# Patient Record
Sex: Female | Born: 1950 | Race: White | Hispanic: No | Smoking: Never smoker
Health system: Southern US, Community
[De-identification: ages and names within clinical notes are randomized; demographics above are authoritative.]

---

## 2006-09-20 ENCOUNTER — Encounter: Admission: RE | Admit: 2006-09-20 | Discharge: 2006-09-20 | Payer: Self-pay | Admitting: Family Medicine

## 2016-03-04 ENCOUNTER — Other Ambulatory Visit: Payer: Self-pay | Admitting: Orthopaedic Surgery

## 2016-03-04 DIAGNOSIS — M47812 Spondylosis without myelopathy or radiculopathy, cervical region: Secondary | ICD-10-CM

## 2016-03-06 ENCOUNTER — Ambulatory Visit
Admission: RE | Admit: 2016-03-06 | Discharge: 2016-03-06 | Disposition: A | Discharge status: Home / Self Care | Payer: Medicare HMO | Source: Ambulatory Visit | Attending: Orthopaedic Surgery | Admitting: Orthopaedic Surgery

## 2016-03-06 DIAGNOSIS — M47812 Spondylosis without myelopathy or radiculopathy, cervical region: Secondary | ICD-10-CM

## 2016-10-21 ENCOUNTER — Ambulatory Visit (INDEPENDENT_AMBULATORY_CARE_PROVIDER_SITE_OTHER): Payer: Medicare HMO | Admitting: Orthopaedic Surgery

## 2016-10-21 ENCOUNTER — Encounter (INDEPENDENT_AMBULATORY_CARE_PROVIDER_SITE_OTHER): Payer: Self-pay | Admitting: Orthopaedic Surgery

## 2016-10-21 ENCOUNTER — Encounter (INDEPENDENT_AMBULATORY_CARE_PROVIDER_SITE_OTHER): Payer: Self-pay

## 2016-10-21 VITALS — BP 120/77 | HR 71 | Ht 60.0 in | Wt 108.0 lb

## 2016-10-21 DIAGNOSIS — M7702 Medial epicondylitis, left elbow: Secondary | ICD-10-CM | POA: Diagnosis not present

## 2016-10-21 DIAGNOSIS — M7701 Medial epicondylitis, right elbow: Secondary | ICD-10-CM

## 2016-10-21 NOTE — Progress Notes (Signed)
Office Visit Note   Patient: Stephanie Villegas           Date of Birth: 12-Dec-1950           MRN: [id number] Visit Date: 10/21/2016              Requested by: No referring provider defined for this encounter. PCP: [name], [name], MD   Assessment & Plan: Visit Diagnoses:  1. Medial epicondylitis of both elbows           Patient is much more symptomatic on the right elbow than left.  Plan: Injection performed right medial condyle with improvement in her symptoms. If she has persistent symptoms we may need to consider EMGs nerve conduction velocities.  Follow-Up Instructions: Return in about 3 weeks (around 11/11/2016), or if symptoms worsen or fail to improve.   Orders:  Orders Placed This Encounter  Procedures  . Medium Joint Injection/Arthrocentesis   No orders of the defined types were placed in this encounter.     Procedures: Medium Joint Inj Date/Time: 10/21/2016 11:29 AM Performed by: Eldred MangesYATES, Dariela Stoker C Authorized by: Eldred MangesYATES, Tasmin Exantus C   Site marked: the procedure site was marked   Location:  Elbow Site:  R elbow Ultrasound Guided: No   Fluoroscopic Guidance: No   Medications:  1 mL bupivacaine 0.5 %; 40 mg methylPREDNISolone acetate 40 MG/ML Aspiration Attempted: No    Right medial epicondyle injection. She had good relief postinjection.   Clinical Data: No additional findings.   Subjective: Chief Complaint  Patient presents with  . Right Elbow - Pain  . Left Elbow - Pain    Patient states bilateral elbow pain, with right elbow being the worse.  States left elbow is starting to bother her due to uses it more.  Has been going to physical therapy, but states it is not helping.  Currently not taking any pain medicine.  Negative for lower extremity weakness. Patient's been using a elbow splint for the medial epicondylitis but his had persistent symptoms.  Review of Systems  Constitutional: Negative for chills and diaphoresis.  HENT: Negative for ear discharge, ear  pain and nosebleeds.   Eyes: Negative for discharge and visual disturbance.  Respiratory: Negative for cough, choking and shortness of breath.   Cardiovascular: Negative for chest pain and palpitations.  Gastrointestinal: Negative for abdominal distention and abdominal pain.  Endocrine: Negative for cold intolerance and heat intolerance.  Genitourinary: Negative for flank pain and hematuria.  Musculoskeletal: Positive for neck pain.       Patient had some mild neck pain. She has had the pain with gripping and denies any activities where she had to do repetitive squeezing gripping. It is painful even with activities of daily living such as picking up a coffee cup or holding a pan.  Skin: Negative for rash and wound.  Neurological: Negative for seizures and speech difficulty.  Hematological: Negative for adenopathy. Does not bruise/bleed easily.  Psychiatric/Behavioral: Negative for agitation and suicidal ideas.     Objective: Vital Signs: BP 120/77   Pulse 71   Ht 5' (1.524 m)   Wt 108 lb (49 kg)   BMI 21.09 kg/m   Physical Exam  Constitutional: She is oriented to person, place, and time. She appears well-developed.  HENT:  Head: Normocephalic.  Right Ear: External ear normal.  Left Ear: External ear normal.  Eyes: Pupils are equal, round, and reactive to light.  Neck: No tracheal deviation present. No thyromegaly present.  Cardiovascular: Normal  rate.   Pulmonary/Chest: Effort normal.  Abdominal: Soft.  Musculoskeletal:  Patient has mild discomfort with percussion over the cubital tunnel right greater than left. No subluxation the ulnar nerve. Ulnar innervated muscles are normal. Mild brachial plexus tenderness on the right minimal left. Negative Spurling negative for bleed. Upper and lower extremity reflexes are 2+ and symmetrical. Chest pain point the medial epicondyle with resisted gripping on the right mild tenderness on the left medial upper condyle. Lateral condyle is normal  for elbow range of motion no impingement of the shoulder. Brachial plexus is only minimal tender on the right none on the left. No forearm flexor atrophy. Fundi supple my have good strength. No interossei weakness. Median nerve at the forearm and wrist is normal. Ulnar nerve at the hyperthenar region is normal.  Neurological: She is alert and oriented to person, place, and time.  Skin: Skin is warm and dry.  Psychiatric: She has a normal mood and affect. Her behavior is normal.    Ortho Exam  Specialty Comments:  No specialty comments available.  Imaging: No results found.   PMFS History: There are no active problems to display for this patient.  No past medical history on file.  No family history on file.  No past surgical history on file. Social History   Occupational History  . Not on file.   Social History Main Topics  . Smoking status: Never Smoker  . Smokeless tobacco: Never Used  . Alcohol use Not on file  . Drug use: Unknown  . Sexual activity: Not on file

## 2016-10-22 MED ORDER — BUPIVACAINE HCL 0.5 % IJ SOLN
1.0000 mL | INTRAMUSCULAR | Status: AC | PRN
  Administered 2016-10-21: 1 mL via INTRA_ARTICULAR

## 2016-10-22 MED ORDER — METHYLPREDNISOLONE ACETATE 40 MG/ML IJ SUSP
40.0000 mg | INTRAMUSCULAR | Status: AC | PRN
  Administered 2016-10-21: 40 mg via INTRA_ARTICULAR

## 2016-11-20 ENCOUNTER — Ambulatory Visit (INDEPENDENT_AMBULATORY_CARE_PROVIDER_SITE_OTHER): Payer: Medicare HMO | Admitting: Orthopaedic Surgery

## 2016-11-20 ENCOUNTER — Encounter (INDEPENDENT_AMBULATORY_CARE_PROVIDER_SITE_OTHER): Payer: Self-pay

## 2016-11-20 ENCOUNTER — Encounter (INDEPENDENT_AMBULATORY_CARE_PROVIDER_SITE_OTHER): Payer: Self-pay | Admitting: Orthopaedic Surgery

## 2016-11-20 VITALS — BP 132/85 | HR 72 | Ht 60.0 in | Wt 108.0 lb

## 2016-11-20 DIAGNOSIS — M47812 Spondylosis without myelopathy or radiculopathy, cervical region: Secondary | ICD-10-CM | POA: Diagnosis not present

## 2016-11-20 DIAGNOSIS — M7701 Medial epicondylitis, right elbow: Secondary | ICD-10-CM | POA: Diagnosis not present

## 2016-11-20 NOTE — Progress Notes (Signed)
Office Visit Note   Patient: Stephanie Villegas           Date of Birth: 11-09-1950           MRN: [id number] Visit Date: 11/20/2016              Requested by: Wilfred Curtis, MD [address] [place], [place] [address] PCP: Wilfred Curtis, MD   Assessment & Plan: Visit Diagnoses:  1. Medial epicondylitis of elbow, right   2. Spondylosis without myelopathy or radiculopathy, cervical region     Plan:Patient got good relief from the injection we had a long discussion about workout activity she should avoid where she has to do significant gripping to avoid problems with medial or lateral epicondylitis. Discussed cervical spondylosis avoiding military press activities. Cardio and core exercises were encouraged. She can return she has increase her symptoms either medial or lateral epicondylitis as well as cervical spondylosis.  Follow-Up Instructions: Return if symptoms worsen or fail to improve.   Orders:  No orders of the defined types were placed in this encounter.  No orders of the defined types were placed in this encounter.     Procedures: No procedures performed   Clinical Data: No additional findings.   Subjective: Chief Complaint  Patient presents with  . Left Elbow - Pain  . Right Elbow - Pain    Patient returns for follow up bilateral elbow pain. She is status post right elbow injection on 10/21/2016. She states that she is doing much better. She has gone to work out and noticed some numbness in the forearm so she would like to discuss what exercises she can do. She does states her left elbow is better as well.   States the medial epicondyle injection on the right to about 2-3 days to kick in and since then she's had absolutely no pain. She is working out with a Psychologist, educational and wanted to discuss activity she should do and not do as part of her workout to avoid reinjury. At times she sometimes has numbness more on the right forearm than the left. Some soreness  associated in the neck region when this occurs. She denies fever or chills.  Review of Systems  Constitutional: Negative for chills and diaphoresis.  HENT: Negative for ear discharge, ear pain and nosebleeds.   Eyes: Negative for discharge and visual disturbance.  Respiratory: Negative for cough, choking and shortness of breath.   Cardiovascular: Negative for chest pain and palpitations.  Gastrointestinal: Negative for abdominal distention and abdominal pain.  Endocrine: Negative for cold intolerance and heat intolerance.  Genitourinary: Negative for flank pain and hematuria.  Musculoskeletal: Positive for neck pain.       Patient relationship problems with balance is working with a Psychologist, educational for this. Previous right lateral epicondylar release. Cervical MRI documented cervical spondylosis primarily right sided C5-6 C6-7.  Skin: Negative for rash and wound.  Neurological: Negative for seizures and speech difficulty.  Hematological: Negative for adenopathy. Does not bruise/bleed easily.  Psychiatric/Behavioral: Negative for agitation and suicidal ideas.     Objective: Vital Signs: BP 132/85   Pulse 72   Ht 5' (1.524 m)   Wt 108 lb (49 kg)   BMI 21.09 kg/m   Physical Exam  Constitutional: She is oriented to person, place, and time. She appears well-developed.  HENT:  Head: Normocephalic.  Right Ear: External ear normal.  Left Ear: External ear normal.  Eyes: Pupils are equal, round, and reactive to light.  Neck: No tracheal deviation present. No thyromegaly present.  Cardiovascular: Normal rate.   Pulmonary/Chest: Effort normal.  Abdominal: Soft.  Musculoskeletal:  This is mildly complex tenderness in the right negative Spurling. Healed lateral epicondylar release incision on the left. No pain with resisted extension. Medial epicondyle is nontender L are reached full extension no impingement the shoulders the rash are exposed skin. Normal heel-to-toe gait. Sensory testing is  intact to the hands. No synovitis of the the fingers or wrist.  Neurological: She is alert and oriented to person, place, and time.  Skin: Skin is warm and dry.  Psychiatric: She has a normal mood and affect. Her behavior is normal.    Ortho Exam upper extremity reflexes are 2+ symmetrical biceps triceps brachioradialis  Specialty Comments:  No specialty comments available.  Imaging: No results found.   PMFS History: There are no active problems to display for this patient.  No past medical history on file.  No family history on file.  No past surgical history on file. Social History   Occupational History  . Not on file.   Social History Main Topics  . Smoking status: Never Smoker  . Smokeless tobacco: Never Used  . Alcohol use Not on file  . Drug use: Unknown  . Sexual activity: Not on file

## 2017-03-19 ENCOUNTER — Encounter (INDEPENDENT_AMBULATORY_CARE_PROVIDER_SITE_OTHER): Payer: Self-pay | Admitting: Orthopaedic Surgery

## 2017-03-19 ENCOUNTER — Ambulatory Visit (INDEPENDENT_AMBULATORY_CARE_PROVIDER_SITE_OTHER): Payer: Medicare HMO | Admitting: Orthopaedic Surgery

## 2017-03-19 VITALS — BP 108/78 | HR 70 | Ht 60.0 in | Wt 110.0 lb

## 2017-03-19 DIAGNOSIS — M25561 Pain in right knee: Secondary | ICD-10-CM

## 2017-03-19 MED ORDER — METHYLPREDNISOLONE ACETATE 40 MG/ML IJ SUSP
40.0000 mg | INTRAMUSCULAR | Status: AC | PRN
  Administered 2017-03-19: 40 mg via INTRA_ARTICULAR

## 2017-03-19 MED ORDER — LIDOCAINE HCL 1 % IJ SOLN
1.0000 mL | INTRAMUSCULAR | Status: AC | PRN
  Administered 2017-03-19: 1 mL

## 2017-03-19 MED ORDER — BUPIVACAINE HCL 0.25 % IJ SOLN
0.6600 mL | INTRAMUSCULAR | Status: AC | PRN
  Administered 2017-03-19: .66 mL via INTRA_ARTICULAR

## 2017-03-19 NOTE — Progress Notes (Signed)
Office Visit Note   Patient: Stephanie Villegas           Date of Birth: 25-Jul-1951           MRN: [id number] Visit Date: 03/19/2017              Requested by: [name], [name], MD [address] [place], [place] [address] PCP: [name], [name], MD   Assessment & Plan: Visit Diagnoses:  1. Right knee pain, unspecified chronicity     Plan: Discussed options requests we performed a right knee intra-articular cortisone injection. She can continue the knee sleeve intermittently occasional eyes. She has persistent symptoms or develops locking she will call we can proceed with MRI scan. Outside x-rays reviewed on disc which showed the no evidence of fracture. Minimal joint narrowing medially. No arterial calcification.  Follow-Up Instructions: No Follow-up on file.   Orders:  Orders Placed This Encounter  Procedures  . Large Joint Injection/Arthrocentesis   No orders of the defined types were placed in this encounter.     Procedures: Large Joint Inj Date/Time: 03/19/2017 8:57 AM Performed by: Eldred MangesYATES, Forrestine Lecrone C Authorized by: Eldred MangesYATES, Valorie Mcgrory C   Consent Given by:  Patient Indications:  Pain and joint swelling Location:  Knee Site:  R knee Needle Size:  22 G Needle Length:  1.5 inches Approach:  Anterolateral Ultrasound Guidance: No   Fluoroscopic Guidance: No   Arthrogram: No   Medications:  1 mL lidocaine 1 %; 40 mg methylPREDNISolone acetate 40 MG/ML; 0.66 mL bupivacaine 0.25 % Aspiration Attempted: No   Patient tolerance:  Patient tolerated the procedure well with no immediate complications     Clinical Data: No additional findings.   Subjective: Chief Complaint  Patient presents with  . Right Knee - Pain    HPI  66 year old female seen for new problem which is right knee pain. She thinks she may have injured in March she's been wearing a knee sleeve taken Advil. States the knee feels like it catches it hobbles her some. States her elbow are doing better but she  is at increased pain in her knee and is going on a cruise in 2 weeks and is concerned she'll have trouble walking. She denies fever chills no rheumatologic conditions. She denies hip pain. No numbness or tingling in her feet. No past history of knee injuries when she was younger. Previous treatment for elbow epicondylitis is doing well.  Review of Systems 14 for his systems updated from 10/21/2016 is unchanged of than improvement in her elbow symptoms and onset of new right knee pain. Has some occasional mild neck discomfort.   Objective: Vital Signs: BP 108/78   Pulse 70   Ht 5' (1.524 m)   Wt 110 lb (49.9 kg)   BMI 21.48 kg/m   Physical Exam  Constitutional: She is oriented to person, place, and time. She appears well-developed.  HENT:  Head: Normocephalic.  Right Ear: External ear normal.  Left Ear: External ear normal.  Eyes: Pupils are equal, round, and reactive to light.  Neck: No tracheal deviation present. No thyromegaly present.  Cardiovascular: Normal rate.   Pulmonary/Chest: Effort normal.  Abdominal: Soft.  Musculoskeletal:  Patient has no pain with hip range of motion straight leg raising 90. Negative Lockman. She is medial joint line tenderness. Some pain with patellofemoral loading. Collateral and cruciate ligament exam is normal. Distal pulses are intact no pitting edema. Sensation her foot is intact no sciatic notch tenderness pelvis is level  no scoliosis. No lymphadenopathy. No venous stasis changes. Increased discomfort with the loading the medial patellar facet. No Baker cyst palpable normal popliteal pulse.  Neurological: She is alert and oriented to person, place, and time.  Skin: Skin is warm and dry.  Psychiatric: She has a normal mood and affect. Her behavior is normal.    Ortho Exam  Specialty Comments:  No specialty comments available.  Imaging: No results found.   PMFS History: There are no active problems to display for this patient.  No past  medical history on file.  No family history on file.  No past surgical history on file. Social History   Occupational History  . Not on file.   Social History Main Topics  . Smoking status: Never Smoker  . Smokeless tobacco: Never Used  . Alcohol use Not on file  . Drug use: Unknown  . Sexual activity: Not on file

## 2017-05-28 ENCOUNTER — Telehealth (INDEPENDENT_AMBULATORY_CARE_PROVIDER_SITE_OTHER): Payer: Self-pay

## 2017-05-28 DIAGNOSIS — M25561 Pain in right knee: Secondary | ICD-10-CM

## 2017-05-28 NOTE — Telephone Encounter (Signed)
Order for MRI entered. Patient is advised.  Ms. Stephanie Villegas would also like to know what can be done about the pain in her elbow. She states that the injections help, but they only last a couple of months and then the pain is back. The pain is much worse right now. She knows that she has small tears that should heal, but she does not feel that they are getting better. Please advise.

## 2017-05-28 NOTE — Telephone Encounter (Signed)
Per last office note, if no relief from knee injection, proceed with MRI. Would you like for me to enter order?

## 2017-05-28 NOTE — Addendum Note (Signed)
Addended by: Rogers SeedsYEATTS, Dimitriy Carreras M on: 05/28/2017 02:15 PM   Modules accepted: Orders

## 2017-05-28 NOTE — Telephone Encounter (Signed)
Patient called stating that the cort. Shot she received in her right knee did not help at all and her right elbow is still bothering her.  Would like to know what the next step will be?  Cb# is [phone].  Please advise.  Thank You.

## 2017-05-28 NOTE — Telephone Encounter (Signed)
Ok for knee MRI thanks , rov after scan thanks

## 2017-05-29 NOTE — Telephone Encounter (Signed)
noted 

## 2017-05-29 NOTE — Telephone Encounter (Signed)
I called and discussed. She is waiting on approval for her knee MRI. Elbows bothering her worse had previous surgery by another surgeon on her elbow. This was back in 2007. 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 She'll call the office schedule appointment so we can check her neck and her elbow and try to help her with that pain. She will call on her own to schedule the appointment.FYI

## 2017-06-12 ENCOUNTER — Ambulatory Visit
Admission: RE | Admit: 2017-06-12 | Discharge: 2017-06-12 | Disposition: A | Discharge status: Home / Self Care | Payer: Medicare HMO | Source: Ambulatory Visit | Attending: Orthopaedic Surgery | Admitting: Orthopaedic Surgery

## 2017-06-12 DIAGNOSIS — M25561 Pain in right knee: Secondary | ICD-10-CM

## 2017-06-18 ENCOUNTER — Ambulatory Visit (INDEPENDENT_AMBULATORY_CARE_PROVIDER_SITE_OTHER): Payer: Medicare HMO | Admitting: Orthopaedic Surgery

## 2017-07-09 ENCOUNTER — Encounter (INDEPENDENT_AMBULATORY_CARE_PROVIDER_SITE_OTHER): Payer: Self-pay | Admitting: Orthopaedic Surgery

## 2017-07-09 ENCOUNTER — Ambulatory Visit (INDEPENDENT_AMBULATORY_CARE_PROVIDER_SITE_OTHER): Payer: Medicare HMO | Admitting: Orthopaedic Surgery

## 2017-07-09 VITALS — BP 123/82 | HR 71 | Ht 60.0 in | Wt 108.0 lb

## 2017-07-09 DIAGNOSIS — M7701 Medial epicondylitis, right elbow: Secondary | ICD-10-CM

## 2017-07-09 DIAGNOSIS — M93261 Osteochondritis dissecans, right knee: Secondary | ICD-10-CM

## 2017-07-09 NOTE — Progress Notes (Signed)
Office Visit Note   Patient: Stephanie Villegas           Date of Birth: [birth year]           MRN: [id number] Visit Date: 07/09/2017              Requested by: [name], [name], MD [address] [place], [place] [address] PCP: [name], [name], MD   Assessment & Plan: Visit Diagnoses:  1. Osteochondritis dissecans of right knee   2. Epicondylitis elbow, medial, right     Plan: She is anxious more than 50% better on Cymbalta. We discussed avoiding stairs, stairmaster. She can run exercise bike for cardiovascular fitness. We reviewed MRI images and report. New tennis elbow brace applied and will check her back again if she has increasing symptoms.  Follow-Up Instructions: No Follow-up on file.   Orders:  No orders of the defined types were placed in this encounter.  No orders of the defined types were placed in this encounter.     Procedures: No procedures performed   Clinical Data: No additional findings.   Subjective: Chief Complaint  Patient presents with  . Right Knee - Pain, Follow-up  . Right Elbow - Pain, Follow-up    HPI patient returns she continues to have problems with her right knee particularly with stairs has some swelling that occurs over the pes bursa and some pain along the medial joint line. MRI scan showed the osteochondral area on the patella with some adjacent changes to the lateral patellar facet suggesting friction syndrome. She's also had continued problems with right medial elbow pain or tenderness several braces worn out which she's used for many months. She recently started some Cymbalta and states that she thinks her pain is probably more than 50% relieved both knee and elbow with the Cymbalta. She's had previous MRI of her elbow which showed some tiny spurring several years ago. Previous injection medial condyle gave her some relief. Sometimes she has pain with outstretched reaching turning or twisting her arm suggesting intra-articular elbow  pathology versus medial epicondylitis. She doesn't really no pain when she does squeezing activities.  Review of Systems  Constitutional: Negative for chills and diaphoresis.  HENT: Negative for ear discharge, ear pain and nosebleeds.   Eyes: Negative for discharge and visual disturbance.  Respiratory: Negative for cough, choking and shortness of breath.   Cardiovascular: Negative for chest pain and palpitations.  Gastrointestinal: Negative for abdominal distention and abdominal pain.  Endocrine: Negative for cold intolerance and heat intolerance.  Genitourinary: Negative for flank pain and hematuria.  Skin: Negative for rash and wound.  Neurological: Negative for seizures and speech difficulty.  Hematological: Negative for adenopathy. Does not bruise/bleed easily.  Psychiatric/Behavioral: Negative for agitation and suicidal ideas.     Objective: Vital Signs: BP 123/82   Pulse 71   Ht 5' (1.524 m)   Wt 108 lb (49 kg)   BMI 21.09 kg/m   Physical Exam  Constitutional: She is oriented to person, place, and time. She appears well-developed.  HENT:  Head: Normocephalic.  Right Ear: External ear normal.  Left Ear: External ear normal.  Eyes: Pupils are equal, round, and reactive to light.  Neck: No tracheal deviation present. No thyromegaly present.  Cardiovascular: Normal rate.   Pulmonary/Chest: Effort normal.  Abdominal: Soft.  Neurological: She is alert and oriented to person, place, and time.  Skin: Skin is warm and dry.  Psychiatric: She has a normal mood and affect. Her  behavior is normal.    Ortho Exam slight tenderness right medial upper condyle. No subluxation of the ulnar nerve. With pronation supination outstretched reaching turning and twisting and also some shoulder range of motion she has pain around the elbow. Negative drop arm test negative impingement long of the biceps is tender minimally. No subluxation of the shoulder. Mild brachial plexus tenderness on the  right negative on the left. Flexion chin to chest normal extension. Reflexes are 2+ normal heel-to-toe gait. Mild medial joint line tenderness some crepitus with knee extension with patellofemoral loading on the right knee. Collateral cruciate ligament exam is normal.  Specialty Comments:  No specialty comments available.  Imaging: contrast  Study Result   CLINICAL DATA:  Medial right knee pain for 6 months.  EXAM: MRI OF THE RIGHT KNEE WITHOUT CONTRAST  TECHNIQUE: Multiplanar, multisequence MR imaging of the knee was performed. No intravenous contrast was administered.  COMPARISON:  None.  FINDINGS: MENISCI  Medial meniscus:  Unremarkable  Lateral meniscus:  Unremarkable  LIGAMENTS  Cruciates:  Unremarkable  Collaterals: Mildly thickened proximal MCL with low-grade surrounding edema suspicious for a small grade 2 sprain.  CARTILAGE  Patellofemoral: 7 mm non-fragmented osteochondral lesion of the medial patellar facet near the posterior patellar ridge, images 9-10 series 3, with faint heterogeneity of the overlying articular cartilage. Mild chondral thinning in the femoral trochlear groove.  Medial:  Mild degenerative chondral thinning.  Lateral:  Moderate degenerative chondral thinning.  Joint: Low-level edema in Hoffa' s fat pad just below the lateral patellar facet, image 1/6 and image 18/5.  Popliteal Fossa:  Unremarkable  Extensor Mechanism: Mildly poor definition of portions of the lateral patellar retinaculum. Otherwise unremarkable.  Bones: No significant extra-articular osseous abnormalities identified.  Other: No supplemental non-categorized findings.  IMPRESSION: 1. Subtle thickening and edema along the medial patellar retinaculum could reflect a mild grade 2 sprain. 2. Mild focal synovitis in Hoffa's fat pad just below the lateral patellar facet. 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 3. Non-fragmented osteochondral lesion of the medial patellar facet. Mild to moderate levels of degenerative chondral thinning in the compartments of the knee. 4. Poor definition of portions the lateral patellar retinaculum especially just below the level of the patella. Remote prior retinacular injury not excluded.   Electronically Signed   By: Gaylyn Rong M.D.   On: 06/13/2017 12:42       PMFS History: There are no active problems to display for this patient.  No past medical history on file.  No family history on file.  No past surgical history on file. Social History   Occupational History  . Not on file.   Social History Main Topics  . Smoking status: Never Smoker  . Smokeless tobacco: Never Used  . Alcohol use Not on file  . Drug use: Unknown  . Sexual activity: Not on file

## 2018-05-06 ENCOUNTER — Ambulatory Visit (INDEPENDENT_AMBULATORY_CARE_PROVIDER_SITE_OTHER): Payer: Medicare HMO

## 2018-05-06 ENCOUNTER — Encounter (INDEPENDENT_AMBULATORY_CARE_PROVIDER_SITE_OTHER): Payer: Self-pay | Admitting: Surgery

## 2018-05-06 ENCOUNTER — Ambulatory Visit (INDEPENDENT_AMBULATORY_CARE_PROVIDER_SITE_OTHER): Payer: Medicare HMO | Admitting: Surgery

## 2018-05-06 DIAGNOSIS — M25562 Pain in left knee: Secondary | ICD-10-CM

## 2018-05-06 DIAGNOSIS — M1712 Unilateral primary osteoarthritis, left knee: Secondary | ICD-10-CM

## 2018-05-06 DIAGNOSIS — M1711 Unilateral primary osteoarthritis, right knee: Secondary | ICD-10-CM | POA: Diagnosis not present

## 2018-05-06 DIAGNOSIS — M25561 Pain in right knee: Secondary | ICD-10-CM | POA: Diagnosis not present

## 2018-05-06 DIAGNOSIS — G8929 Other chronic pain: Secondary | ICD-10-CM

## 2018-05-06 NOTE — Progress Notes (Signed)
Office Visit Note   Patient: Stephanie Villegas           Date of Birth: 12/16/1950           MRN: [id number] Visit Date: 05/06/2018              Requested by: [name], [name], MD [address] [place], [place] [address] PCP: [name], [name], MD   Assessment & Plan: Visit Diagnoses:  1. Chronic pain of both knees   2. Unilateral primary osteoarthritis, left knee   3. Arthritis of right knee     Plan: Since patient states that her knee pain is actually doing better today she can take naproxen as needed.  If knee pain returns she will come back to see me for intra-articular Marcaine/Depo-Medrol injections.  All questions answered. Follow-Up Instructions: Return if symptoms worsen or fail to improve.   Orders:  Orders Placed This Encounter  Procedures  . XR KNEE 3 VIEW LEFT  . XR KNEE 3 VIEW RIGHT   No orders of the defined types were placed in this encounter.     Procedures: No procedures performed   Clinical Data: No additional findings.   Subjective: Chief Complaint  Patient presents with  . Left Knee - Pain    R<L  . Right Knee - Pain    HPI 67 year old white female comes in today with complaints of left greater than right knee pain.  She was last seen in the office by Dr. Kevan NyGates last year for right knee issues.  She had right knee MRI scan performed June 13, 2017 and report showed: EXAM: MRI OF THE RIGHT KNEE WITHOUT CONTRAST  TECHNIQUE: Multiplanar, multisequence MR imaging of the knee was performed. No intravenous contrast was administered.  COMPARISON:  None.  FINDINGS: MENISCI  Medial meniscus:  Unremarkable  Lateral meniscus:  Unremarkable  LIGAMENTS  Cruciates:  Unremarkable  Collaterals: Mildly thickened proximal MCL with low-grade surrounding edema suspicious for a small grade 2 sprain.  CARTILAGE  Patellofemoral: 7 mm non-fragmented osteochondral lesion of the medial patellar facet near the posterior patellar  ridge, images 9-10 series 3, with faint heterogeneity of the overlying articular cartilage. Mild chondral thinning in the femoral trochlear groove.  Medial:  Mild degenerative chondral thinning.  Lateral:  Moderate degenerative chondral thinning.  Joint: Low-level edema in Hoffa' s fat pad just below the lateral patellar facet, image 1/6 and image 18/5.  Popliteal Fossa:  Unremarkable  Extensor Mechanism: Mildly poor definition of portions of the lateral patellar retinaculum. Otherwise unremarkable.  Bones: No significant extra-articular osseous abnormalities identified.  Other: No supplemental non-categorized findings.  IMPRESSION: 1. Subtle thickening and edema along the medial patellar retinaculum could reflect a mild grade 2 sprain. 2. Mild focal synovitis in Hoffa's fat pad just below the lateral patellar facet. This can be seen in setting of patellar tendon -lateral femoral condyle friction syndrome. 3. Non-fragmented osteochondral lesion of the medial patellar facet. Mild to moderate levels of degenerative chondral thinning in the compartments of the knee. 4. Poor definition of portions the lateral patellar retinaculum especially just below the level of the patella. Remote prior retinacular injury not excluded.  She had fairly good response with Marcaine/Depo-Medrol injection.  2 months ago she fell coming down 4 steps landing directly onto both knees.  Since then she has had off-and-on left greater than right knee pain.  Did not have any problems with the left knee before injury.  She describes bilateral patellofemoral pain with  going up and down stairs.  Over the last couple days knee pain has improved.  Not describing any true mechanical symptoms or feeling of instability.  Left knee she also does have some pain over the medial joint line.  Review of Systems No current cardiac pulmonary GI GU issues  Objective: Vital Signs: There were no vitals taken for this  visit.  Physical Exam  Constitutional: She is oriented to person, place, and time. No distress.  HENT:  Head: Normocephalic and atraumatic.  Eyes: Pupils are equal, round, and reactive to light. EOM are normal.  Neck: Normal range of motion.  Pulmonary/Chest: No respiratory distress.  Musculoskeletal:  Gait is normal.  Negative logroll bilateral hips.  Negative straight leg raise.  Bile knees good range of motion.  She actually has left greater than right patellofemoral crepitus.  Right knee mildly tender medial plica.  Negative McMurray's test bilateral knees.  Cruciate collateral ligaments are stable bilaterally.  Left knee she is mildly tender over the medial joint line.  No palpable effusion.  Bilateral calves nontender.  Neurological: She is alert and oriented to person, place, and time.  Skin: Skin is warm and dry.  Psychiatric: She has a normal mood and affect.    Ortho Exam  Specialty Comments:  No specialty comments available.  Imaging: No results found.   PMFS History: There are no active problems to display for this patient.  History reviewed. No pertinent past medical history.  History reviewed. No pertinent family history.  History reviewed. No pertinent surgical history. Social History   Occupational History  . Not on file  Tobacco Use  . Smoking status: Never Smoker  . Smokeless tobacco: Never Used  Substance and Sexual Activity  . Alcohol use: Not on file  . Drug use: Not on file  . Sexual activity: Not on file

## 2018-05-20 ENCOUNTER — Ambulatory Visit (INDEPENDENT_AMBULATORY_CARE_PROVIDER_SITE_OTHER): Payer: Medicare HMO | Admitting: Surgery

## 2018-05-20 ENCOUNTER — Encounter (INDEPENDENT_AMBULATORY_CARE_PROVIDER_SITE_OTHER): Payer: Self-pay | Admitting: Surgery

## 2018-05-20 VITALS — Ht 60.0 in | Wt 108.0 lb

## 2018-05-20 DIAGNOSIS — M25561 Pain in right knee: Secondary | ICD-10-CM

## 2018-05-20 DIAGNOSIS — M25562 Pain in left knee: Secondary | ICD-10-CM | POA: Diagnosis not present

## 2018-05-20 DIAGNOSIS — G8929 Other chronic pain: Secondary | ICD-10-CM | POA: Diagnosis not present

## 2018-05-20 MED ORDER — METHYLPREDNISOLONE ACETATE 40 MG/ML IJ SUSP
40.0000 mg | INTRAMUSCULAR | Status: AC | PRN
  Administered 2018-05-20: 40 mg via INTRA_ARTICULAR

## 2018-05-20 MED ORDER — LIDOCAINE HCL 1 % IJ SOLN
3.0000 mL | INTRAMUSCULAR | Status: AC | PRN
  Administered 2018-05-20: 3 mL

## 2018-05-20 NOTE — Progress Notes (Signed)
   Office Visit Note   Patient: Stephanie Villegas           Date of Birth: 02/26/1951           MRN: [id number] Visit Date: 05/20/2018              Requested by: [name], [name], MD [address] [place], [place] [address] PCP: [name], [name], MD   Assessment & Plan: Visit Diagnoses:  1. Chronic pain of both knees     Plan: Patient's ongoing pain today I elected to do bilateral knee injections.  Patient sent bile knees were prepped with Betadine and intra-articular Marcaine/Depo-Medrol injection was done.  Follow-up in 4 weeks for recheck.  A week before patient scheduled appointment she will contact me and let me know how she is feeling.  If she continues to be symptomatic I may consider getting MRI scans before next appointment.  All questions answered.  Follow-Up Instructions: Return in about 4 weeks (around 06/17/2018) for with Fayrene FearingJames recheck knees.   Orders:  No orders of the defined types were placed in this encounter.  No orders of the defined types were placed in this encounter.     Procedures: Large Joint Inj: bilateral knee on 05/20/2018 9:23 AM Indications: pain Details: 25 G 1.5 in needle, anteromedial approach Medications (Right): 3 mL lidocaine 1 %; 40 mg methylPREDNISolone acetate 40 MG/ML Medications (Left): 3 mL lidocaine 1 %; 40 mg methylPREDNISolone acetate 40 MG/ML Outcome: tolerated well, no immediate complications Consent was given by the patient. Patient was prepped and draped in the usual sterile fashion.       Clinical Data: No additional findings.   Subjective: No chief complaint on file.   HPI 526 67-year-old white female returns with complaints of left greater than right knee pain.  States that she still continues to be symptomatic.  Describe mechanical symptoms on the left side more than right.  She would like to have both knees injected today as previously discussed last office visit.  Pain when she is ambulating, pivoting,  squatting. Review of Systems No current cardiac pulmonary GI GU issues  Objective: Vital Signs: There were no vitals taken for this visit.  Physical Exam Gait antalgic.  Extremely pleasant white female who looks much younger than stated age. Ortho Exam  Specialty Comments:  No specialty comments available.  Imaging: No results found.   PMFS History: There are no active problems to display for this patient.  History reviewed. No pertinent past medical history.  History reviewed. No pertinent family history.  History reviewed. No pertinent surgical history. Social History   Occupational History  . Not on file  Tobacco Use  . Smoking status: Never Smoker  . Smokeless tobacco: Never Used  Substance and Sexual Activity  . Alcohol use: Not on file  . Drug use: Not on file  . Sexual activity: Not on file

## 2018-06-10 ENCOUNTER — Encounter (INDEPENDENT_AMBULATORY_CARE_PROVIDER_SITE_OTHER): Payer: Self-pay | Admitting: Surgery

## 2018-06-10 ENCOUNTER — Ambulatory Visit (INDEPENDENT_AMBULATORY_CARE_PROVIDER_SITE_OTHER): Payer: Medicare HMO | Admitting: Surgery

## 2018-06-10 DIAGNOSIS — G8929 Other chronic pain: Secondary | ICD-10-CM

## 2018-06-10 DIAGNOSIS — M25562 Pain in left knee: Secondary | ICD-10-CM | POA: Diagnosis not present

## 2018-06-10 NOTE — Progress Notes (Signed)
   Office Visit Note   Patient: Stephanie Villegas           Date of Birth: 10-25-1950           MRN: [id number] Visit Date: 06/10/2018              Requested by: [name], [name], MD [address] [place], [place] [address] PCP: [name], [name], MD   Assessment & Plan: Visit Diagnoses:  1. Chronic pain of left knee     Plan: With patient's ongoing knee pain that has failed conservative treatment most recently with intra-articular Marcaine/Depo-Medrol injection I will schedule MRI to rule out meniscal tear/OCD.  Follow with Dr. Ophelia CharterYates in few weeks for recheck to discuss results and further treatment options.  States that she is going on vacation to [place] in about a week and so I did agree to repeat left knee injection today.  Patient understands that usually I do not repeat these injections so quickly but I am hoping to make her trip more enjoyable.  Follow-Up Instructions: Return in about 4 weeks (around 07/08/2018) for With Dr. Ophelia CharterYates to review left knee MRI.   Orders:  Orders Placed This Encounter  Procedures  . MR Knee Left w/o contrast   No orders of the defined types were placed in this encounter.     Procedures: No procedures performed   Clinical Data: No additional findings.   Subjective: Chief Complaint  Patient presents with  . Right Knee - Pain  . Left Knee - Pain    HPI 67 year old white female returns for recheck of both knees.  States that right knee is better after previous intra-articular Marcaine/Depo-Medrol injection.  Left knee did well for a couple weeks.  Continues to complain of pain mechanical symptoms mostly around the medial aspect.  Worse when she is squatting or pivoting.  She is going on a trip to [place] September 28 and is wanting to see if I can do something to help make her trip more enjoyable. Review of Systems No current cardiac pulmonary GI GU issues  Objective: Vital Signs: BP 109/77   Pulse 66   Ht 5' (1.524 m)   Wt 108 lb (49  kg)   BMI 21.09 kg/m   Physical Exam  Constitutional: She is oriented to person, place, and time. She appears well-developed. No distress.  HENT:  Head: Normocephalic and atraumatic.  Eyes: EOM are normal.  Pulmonary/Chest: No respiratory distress.  Musculoskeletal:  Left knee good range of motion.  Mild swelling.  Medial joint line tender.  Positive McMurray's test.  Ligaments are stable.  Calf nontender.  Neurological: She is alert and oriented to person, place, and time.    Ortho Exam  Specialty Comments:  No specialty comments available.  Imaging: No results found.   PMFS History: There are no active problems to display for this patient.  History reviewed. No pertinent past medical history.  History reviewed. No pertinent family history.  History reviewed. No pertinent surgical history. Social History   Occupational History  . Not on file  Tobacco Use  . Smoking status: Never Smoker  . Smokeless tobacco: Never Used  Substance and Sexual Activity  . Alcohol use: Not on file  . Drug use: Not on file  . Sexual activity: Not on file

## 2018-06-17 ENCOUNTER — Ambulatory Visit (INDEPENDENT_AMBULATORY_CARE_PROVIDER_SITE_OTHER): Payer: Medicare HMO | Admitting: Surgery

## 2018-07-06 ENCOUNTER — Ambulatory Visit
Admission: RE | Admit: 2018-07-06 | Discharge: 2018-07-06 | Disposition: A | Discharge status: Home / Self Care | Payer: Medicare HMO | Source: Ambulatory Visit | Attending: Surgery | Admitting: Surgery

## 2018-07-06 DIAGNOSIS — G8929 Other chronic pain: Secondary | ICD-10-CM

## 2018-07-06 DIAGNOSIS — M25562 Pain in left knee: Principal | ICD-10-CM

## 2018-07-08 ENCOUNTER — Telehealth (INDEPENDENT_AMBULATORY_CARE_PROVIDER_SITE_OTHER): Payer: Self-pay | Admitting: Radiology

## 2018-07-08 ENCOUNTER — Encounter (INDEPENDENT_AMBULATORY_CARE_PROVIDER_SITE_OTHER): Payer: Self-pay | Admitting: Orthopaedic Surgery

## 2018-07-08 ENCOUNTER — Ambulatory Visit (INDEPENDENT_AMBULATORY_CARE_PROVIDER_SITE_OTHER): Payer: Medicare HMO | Admitting: Orthopaedic Surgery

## 2018-07-08 VITALS — BP 128/93 | HR 88 | Ht 60.0 in | Wt 109.0 lb

## 2018-07-08 DIAGNOSIS — M1711 Unilateral primary osteoarthritis, right knee: Secondary | ICD-10-CM | POA: Diagnosis not present

## 2018-07-08 NOTE — Telephone Encounter (Signed)
Noted  

## 2018-07-08 NOTE — Progress Notes (Signed)
Office Visit Note   Patient: Stephanie Villegas           Date of Birth: 1951-01-01           MRN: [id number] Visit Date: 07/08/2018              Requested by: Wilfred Curtis, MD [address] [place], [place] [address] PCP: Wilfred Curtis, MD   Assessment & Plan: Visit Diagnoses:  1. Unilateral primary osteoarthritis, right knee          Severe and patellofemoral joint and moderately severe medial compartment.  We will seek approval from her insurance company for a single Monovisc injection to see if this gives her some relief.  We reviewed the MRI scan I gave her a copy of the report.  She has severe full-thickness patellofemoral degenerative changes but symptoms are present she does not think it is severe enough to consider total knee arthroplasty.  She like to proceed with the Visco injection at her request.  Plan: Follow-up visit for Monovisc and back injection once is approved for her right knee.  Follow-Up Instructions: No follow-ups on file.   Orders:  No orders of the defined types were placed in this encounter.  No orders of the defined types were placed in this encounter.     Procedures: No procedures performed   Clinical Data: No additional findings.   Subjective: Chief Complaint  Patient presents with  . Left Knee - Follow-up    HPI 67 year old female returns with ongoing problems with her left knee primarily going from sitting to standing as well as walking upstairs which she states is extremely painful.  She is used over the counter ibuprofen in the past knee injection 06/10/2018 with persistent symptoms.  MRI scan has been obtained and is available for review today.  She denies chills or fever no rheumatologic conditions.  She enjoys beach music and is not able to [place] dance due to her knee symptoms.  She is taken anti-inflammatories and had cortisone injection without sustained relief.  Review of Systems 14 point review of systems updated unchanged  from 07/09/2017 office note.   Objective: Vital Signs: BP (!) 128/93   Pulse 88   Ht 5' (1.524 m)   Wt 109 lb (49.4 kg)   BMI 21.29 kg/m   Physical Exam  Constitutional: She is oriented to person, place, and time. She appears well-developed.  HENT:  Head: Normocephalic.  Right Ear: External ear normal.  Left Ear: External ear normal.  Eyes: Pupils are equal, round, and reactive to light.  Neck: No tracheal deviation present. No thyromegaly present.  Cardiovascular: Normal rate.  Pulmonary/Chest: Effort normal.  Abdominal: Soft.  Neurological: She is alert and oriented to person, place, and time.  Skin: Skin is warm and dry.  Psychiatric: She has a normal mood and affect. Her behavior is normal.    Ortho Exam patient has some medial joint line tenderness pain with patellofemoral loading crepitus with knee extension.  Sharp pain when she gets up from sitting to standing and does not use her hands to help push herself up.  In standing position she is comfortable.  Pain with stair step.  Collateral crucial ligament exam is normal no pain with hip range of motion no sciatic notch tenderness.  Negative logroll to the hips distal pulses are intact no pitting edema no cellulitis. Specialty Comments:  No specialty comments available.  Imaging: CLINICAL DATA:  Locking catching. Snapping and crepitus in  the knee for greater than 6 weeks.  EXAM: MRI OF THE LEFT KNEE WITHOUT CONTRAST  TECHNIQUE: Multiplanar, multisequence MR imaging of the knee was performed. No intravenous contrast was administered.  COMPARISON:  None.  FINDINGS: MENISCI  Medial meniscus:  Intact.  Lateral meniscus:  Intact.  LIGAMENTS  Cruciates:  Intact ACL and PCL.  Collaterals:  Insert collateral  CARTILAGE  Patellofemoral: High-grade partial-thickness cartilage loss with full-thickness cartilage fissuring of the medial patellar facet with severe subchondral reactive marrow edema and  cystic changes.  Medial: Focal area of full-thickness cartilage loss of the medial femoral condyle the posterior weight-bearing surface measuring 8 x 12 mm.  Lateral:  No chondral defect.  Joint: No joint effusion. Mild edema in superolateral Hoffa's fat. No plical thickening.  Popliteal Fossa:  No Baker cyst. Intact popliteus tendon.  Extensor Mechanism: Intact quadriceps tendon. Intact patellar tendon. Intact medial patellar retinaculum. Intact lateral patellar retinaculum. Intact MPFL. TT-TG distance 11 mm.  Bones:  No acute osseous abnormality.  No aggressive osseous lesion.  Other: No fluid collection or hematoma.  Muscles are normal.  IMPRESSION: 1. No meniscal or ligamentous injury of the left knee. 2. High-grade partial-thickness cartilage loss with full-thickness cartilage fissuring of the medial patellar facet with severe subchondral reactive marrow edema and cystic changes. 3. Focal area of full-thickness cartilage loss of the medial femoral condyle the posterior weight-bearing surface measuring 8 x 12 mm. 4. Mild edema in superolateral Hoffa's fat as can be seen with patellar tendon-lateral femoral condyle friction syndrome.   Electronically Signed   By: Elige Ko   On: 07/06/2018 10:48   PMFS History: There are no active problems to display for this patient.  No past medical history on file.  No family history on file.  No past surgical history on file. Social History   Occupational History  . Not on file  Tobacco Use  . Smoking status: Never Smoker  . Smokeless tobacco: Never Used  Substance and Sexual Activity  . Alcohol use: Not on file  . Drug use: Not on file  . Sexual activity: Not on file

## 2018-07-08 NOTE — Telephone Encounter (Signed)
Please submit for approval for Monovisc injection.

## 2018-07-10 ENCOUNTER — Telehealth (INDEPENDENT_AMBULATORY_CARE_PROVIDER_SITE_OTHER): Payer: Self-pay

## 2018-07-10 NOTE — Telephone Encounter (Signed)
Submitted VOB for Monovisc, left knee. 

## 2018-07-17 ENCOUNTER — Telehealth (INDEPENDENT_AMBULATORY_CARE_PROVIDER_SITE_OTHER): Payer: Self-pay

## 2018-07-17 NOTE — Telephone Encounter (Signed)
Talked with patient and advised her that she is approved for Monovisc, left knee. Buy & Bill Patient will be responsible for 20% OOP. No PA required for Monvisc per [facility] Reference# [id number]  Appt.scheduled 07/20/2018

## 2018-07-20 ENCOUNTER — Encounter (INDEPENDENT_AMBULATORY_CARE_PROVIDER_SITE_OTHER): Payer: Self-pay | Admitting: Orthopaedic Surgery

## 2018-07-20 ENCOUNTER — Ambulatory Visit (INDEPENDENT_AMBULATORY_CARE_PROVIDER_SITE_OTHER): Payer: Medicare HMO | Admitting: Orthopaedic Surgery

## 2018-07-20 VITALS — BP 133/90 | HR 72 | Ht 60.0 in | Wt 109.0 lb

## 2018-07-20 DIAGNOSIS — M1712 Unilateral primary osteoarthritis, left knee: Secondary | ICD-10-CM | POA: Diagnosis not present

## 2018-07-20 MED ORDER — HYALURONAN 88 MG/4ML IX SOSY
88.0000 mg | PREFILLED_SYRINGE | INTRA_ARTICULAR | Status: AC | PRN
  Administered 2018-07-20: 88 mg via INTRA_ARTICULAR

## 2018-07-20 MED ORDER — LIDOCAINE HCL 1 % IJ SOLN
0.5000 mL | INTRAMUSCULAR | Status: AC | PRN
  Administered 2018-07-20: .5 mL

## 2018-07-20 NOTE — Progress Notes (Signed)
   Office Visit Note   Patient: Stephanie Villegas           Date of Birth: 06-Apr-1951           MRN: [id number] Visit Date: 07/20/2018              Requested by: Wilfred Curtis, MD [address] [place], [place] [address] PCP: Wilfred Curtis, MD   Assessment & Plan: Visit Diagnoses:  1. Arthritis of left knee     Plan: left knee Monovisc injection done. She toterated it well. She will call in a few weeks and let us know how she is doing.   Follow-Up Instructions: No follow-ups on file.   Orders:  Orders Placed This Encounter  Procedures  . Large Joint Inj   No orders of the defined types were placed in this encounter.     Procedures: Large Joint Inj: L knee on 07/20/2018 3:32 PM Indications: joint swelling and pain Details: 22 G 1.5 in needle, anterolateral approach  Arthrogram: No  Medications: 0.5 mL lidocaine 1 %; 88 mg Hyaluronan 88 MG/4ML Outcome: tolerated well, no immediate complications Procedure, treatment alternatives, risks and benefits explained, specific risks discussed. Consent was given by the patient. Immediately prior to procedure a time out was called to verify the correct patient, procedure, equipment, support staff and site/side marked as required. Patient was prepped and draped in the usual sterile fashion.       Clinical Data: No additional findings.   Subjective: Chief Complaint  Patient presents with  . Left Knee - Pain    Monovisc Injection Left Knee    HPIpt returns for left knee monovisc injection for OA. See previous note.   Review of Systemsunchanged.    Objective: Vital Signs: BP 133/90   Pulse 72   Ht 5' (1.524 m)   Wt 109 lb (49.4 kg)   BMI 21.29 kg/m   Physical Examno change from last visit.   Ortho Examknee tenderness medial and PF joint.   Specialty Comments:  No specialty comments available.  Imaging: No results found.   PMFS History: Patient Active Problem List   Diagnosis Date Noted  .  Unilateral primary osteoarthritis, right knee 07/08/2018   No past medical history on file.  No family history on file.  No past surgical history on file. Social History   Occupational History  . Not on file  Tobacco Use  . Smoking status: Never Smoker  . Smokeless tobacco: Never Used  Substance and Sexual Activity  . Alcohol use: Not on file  . Drug use: Not on file  . Sexual activity: Not on file

## 2018-07-30 ENCOUNTER — Telehealth (INDEPENDENT_AMBULATORY_CARE_PROVIDER_SITE_OTHER): Payer: Self-pay | Admitting: Orthopaedic Surgery

## 2018-07-30 NOTE — Telephone Encounter (Signed)
Recent Monovisc injection in left knee. OK to submit for authorization for right knee?

## 2018-07-30 NOTE — Telephone Encounter (Signed)
Patient called to let Dr. Ophelia Charter know that she is wanting a gel injection in her right knee.  CB#[phone].  Thank you.

## 2018-07-30 NOTE — Telephone Encounter (Signed)
OK 

## 2018-07-30 NOTE — Telephone Encounter (Signed)
Please see below. Patient would like to have Monovisc injection in right knee.  Thanks.

## 2018-07-31 NOTE — Telephone Encounter (Signed)
Noted  

## 2018-08-12 ENCOUNTER — Telehealth (INDEPENDENT_AMBULATORY_CARE_PROVIDER_SITE_OTHER): Payer: Self-pay

## 2018-08-12 NOTE — Telephone Encounter (Signed)
Error

## 2018-08-12 NOTE — Telephone Encounter (Signed)
Submitted VOB for Monovisc, right knee. 

## 2018-09-08 ENCOUNTER — Telehealth (INDEPENDENT_AMBULATORY_CARE_PROVIDER_SITE_OTHER): Payer: Self-pay

## 2018-09-08 NOTE — Telephone Encounter (Signed)
Talked with patient and advised her that she is approved for gel injection.  Approved for Monovisc, right knee. Buy & Bill Patient will be responsible for 20% OOP. No Co-pay No PA required, per Tiffany F.with [employer]12/17/2019.  Appt.09/22/2018 with Dr. Ophelia CharterYates

## 2018-09-22 ENCOUNTER — Ambulatory Visit (INDEPENDENT_AMBULATORY_CARE_PROVIDER_SITE_OTHER): Payer: Medicare HMO | Admitting: Orthopaedic Surgery

## 2018-09-22 ENCOUNTER — Encounter (INDEPENDENT_AMBULATORY_CARE_PROVIDER_SITE_OTHER): Payer: Self-pay | Admitting: Orthopaedic Surgery

## 2018-09-22 VITALS — BP 143/89 | HR 80 | Ht 60.0 in | Wt 108.0 lb

## 2018-09-22 DIAGNOSIS — M1711 Unilateral primary osteoarthritis, right knee: Secondary | ICD-10-CM | POA: Diagnosis not present

## 2018-09-22 MED ORDER — LIDOCAINE HCL 1 % IJ SOLN
0.5000 mL | INTRAMUSCULAR | Status: AC | PRN
  Administered 2018-09-22: .5 mL

## 2018-09-22 MED ORDER — HYALURONAN 88 MG/4ML IX SOSY
88.0000 mg | PREFILLED_SYRINGE | INTRA_ARTICULAR | Status: AC | PRN
  Administered 2018-09-22: 88 mg via INTRA_ARTICULAR

## 2018-09-22 NOTE — Progress Notes (Signed)
   Office Visit Note   Patient: Stephanie Villegas           Date of Birth: 02-Jun-1951           MRN: [id number] Visit Date: 09/22/2018              Requested by: [name], [name], MD [address] [place], [place] [address] PCP: [name], [name], MD   Assessment & Plan: Visit Diagnoses:  1. Unilateral primary osteoarthritis, right knee     Plan: Previous MRI showed partial-thickness loss.  She is got excellent result with Monovisc injection in her left knee several months ago.  She is here today for injection in her right knee which was performed.  She can get it repeated in 6 months if she gets excellent relief once again.  Follow-Up Instructions: No follow-ups on file.   Orders:  Orders Placed This Encounter  Procedures  . Large Joint Inj   No orders of the defined types were placed in this encounter.     Procedures: Large Joint Inj: R knee on 09/22/2018 4:12 PM Indications: pain and joint swelling Details: 22 G 1.5 in needle, anterolateral approach  Arthrogram: No  Medications: 0.5 mL lidocaine 1 %; 88 mg Hyaluronan 88 MG/4ML Outcome: tolerated well, no immediate complications Procedure, treatment alternatives, risks and benefits explained, specific risks discussed. Consent was given by the patient. Immediately prior to procedure a time out was called to verify the correct patient, procedure, equipment, support staff and site/side marked as required. Patient was prepped and draped in the usual sterile fashion.       Clinical Data: No additional findings.   Subjective: Chief Complaint  Patient presents with  . Right Knee - Follow-up    HPI patient returns for  Monovisc injection into her right knee.  Left knee previous injection 07/20/2018 after 3 weeks gave her great relief and she returns for her injection now in her right knee.  Review of Systems updated and unchanged.   Objective: Vital Signs: BP (!) 143/89   Pulse 80   Ht 5' (1.524 m)   Wt 108  lb (49 kg)   BMI 21.09 kg/m   Physical Exam alert oriented no shortness of breath vital signs regular.  Ortho Exam mild crepitus with knee range of motion.  Specialty Comments:  No specialty comments available.  Imaging: No results found.   PMFS History: Patient Active Problem List   Diagnosis Date Noted  . Unilateral primary osteoarthritis, right knee 07/08/2018   No past medical history on file.  No family history on file.  No past surgical history on file. Social History   Occupational History  . Not on file  Tobacco Use  . Smoking status: Never Smoker  . Smokeless tobacco: Never Used  Substance and Sexual Activity  . Alcohol use: Not on file  . Drug use: Not on file  . Sexual activity: Not on file

## 2019-03-16 ENCOUNTER — Ambulatory Visit: Payer: Self-pay

## 2019-03-16 ENCOUNTER — Other Ambulatory Visit: Payer: Self-pay

## 2019-03-16 ENCOUNTER — Telehealth: Payer: Self-pay | Admitting: Radiology

## 2019-03-16 ENCOUNTER — Ambulatory Visit (INDEPENDENT_AMBULATORY_CARE_PROVIDER_SITE_OTHER): Payer: Medicare HMO | Admitting: Orthopaedic Surgery

## 2019-03-16 ENCOUNTER — Encounter: Payer: Self-pay | Admitting: Orthopaedic Surgery

## 2019-03-16 VITALS — Ht 60.0 in | Wt 111.0 lb

## 2019-03-16 DIAGNOSIS — M25512 Pain in left shoulder: Secondary | ICD-10-CM

## 2019-03-16 DIAGNOSIS — M7542 Impingement syndrome of left shoulder: Secondary | ICD-10-CM

## 2019-03-16 NOTE — Telephone Encounter (Signed)
Please seek approval for gel injections bilateral knees. Patient had Left knee Monovisc Injection 07/20/18 and Right Knee Monovisc Injection 09/22/18. Thanks.

## 2019-03-16 NOTE — Progress Notes (Signed)
Office Visit Note   Patient: Stephanie Villegas           Date of Birth: Jan 18, 1951           MRN: [id number] Visit Date: 03/16/2019              Requested by: [name], [name], MD [address] [place] [id number] PCP: [name], [name], MD   Assessment & Plan: Visit Diagnoses:  1. Acute pain of left shoulder     Plan: Subacromial injection performed which she tolerated well.  Patient got good relief last year with the Visco injection in her knees and is requesting repeat authorization to her insurance company.  If she has persistent problems with his shoulder we can consider diagnostic imaging.  Postinjection she had improved range of motion and decreased pain.  Follow-Up Instructions: No follow-ups on file.   Orders:  Orders Placed This Encounter  Procedures  . XR Shoulder Left   No orders of the defined types were placed in this encounter.     Procedures: Large Joint Inj: L subacromial bursa on 03/16/2019 9:43 AM Indications: pain Details: 22 G 1.5 in needle  Arthrogram: No  Medications: 4 mL bupivacaine 0.25 %; 40 mg methylPREDNISolone acetate 40 MG/ML; 0.5 mL lidocaine 1 % Outcome: tolerated well, no immediate complications Procedure, treatment alternatives, risks and benefits explained, specific risks discussed. Consent was given by the patient. Immediately prior to procedure a time out was called to verify the correct patient, procedure, equipment, support staff and site/side marked as required. Patient was prepped and draped in the usual sterile fashion.       Clinical Data: No additional findings.   Subjective: Chief Complaint  Patient presents with  . Left Shoulder - Pain  . Left Knee - Pain  . Right Knee - Pain    HPI 68 year old female seen with onset of left shoulder pain x2 months.  Patient been through physical therapy and states she can barely reach her arm up overhead.  She has increased pain with abduction and with flexion above  the shoulder level.  No numbness or tingling no associated neck pain.  She is also had some problems with bilateral knee pain and is requesting scheduling repeat Visco injections.  She is tried cortisone injection without relief but did get real relief with the Visco injections.  She has been taking meloxicam regularly.  Review of Systems updated unchanged from 07/09/2017 other than as mentioned in HPI.  Problems with bilateral knee osteoarthritis.   Objective: Vital Signs: Ht 5' (1.524 m)   Wt 111 lb (50.3 kg)   BMI 21.68 kg/m   Physical Exam Constitutional:      Appearance: She is well-developed.  HENT:     Head: Normocephalic.     Right Ear: External ear normal.     Left Ear: External ear normal.  Eyes:     Pupils: Pupils are equal, round, and reactive to light.  Neck:     Thyroid: No thyromegaly.     Trachea: No tracheal deviation.  Cardiovascular:     Rate and Rhythm: Normal rate.  Pulmonary:     Effort: Pulmonary effort is normal.  Abdominal:     Palpations: Abdomen is soft.  Skin:    General: Skin is warm and dry.  Neurological:     Mental Status: She is alert and oriented to person, place, and time.  Psychiatric:        Behavior: Behavior normal.  Ortho Exam patient has positive left shoulder impingement.  Acromioclavicular joints normal no brachial plexus tenderness.  Negative Spurling.  Biceps triceps brachial radialis are 2+ and symmetrical.  Positive Neer.  Negative Hawkins.  Long of the biceps is normal.  Patient lacks 15-20 degrees full abduction.  Passively I can get her arm up overhead without significant adhesions or resistance.  Triceps biceps is normal.  Station the hand is intact.  Specialty Comments:  No specialty comments available.  Imaging: No results found.   PMFS History: Patient Active Problem List   Diagnosis Date Noted  . Unilateral primary osteoarthritis, right knee 07/08/2018   No past medical history on file.  No family history  on file.  No past surgical history on file. Social History   Occupational History  . Not on file  Tobacco Use  . Smoking status: Never Smoker  . Smokeless tobacco: Never Used  Substance and Sexual Activity  . Alcohol use: Not on file  . Drug use: Not on file  . Sexual activity: Not on file

## 2019-03-17 MED ORDER — LIDOCAINE HCL 1 % IJ SOLN
0.5000 mL | INTRAMUSCULAR | Status: AC | PRN
  Administered 2019-03-16: .5 mL

## 2019-03-17 MED ORDER — METHYLPREDNISOLONE ACETATE 40 MG/ML IJ SUSP
40.0000 mg | INTRAMUSCULAR | Status: AC | PRN
  Administered 2019-03-16: 10:00:00 40 mg via INTRA_ARTICULAR

## 2019-03-17 MED ORDER — BUPIVACAINE HCL 0.25 % IJ SOLN
4.0000 mL | INTRAMUSCULAR | Status: AC | PRN
  Administered 2019-03-16: 10:00:00 4 mL via INTRA_ARTICULAR

## 2019-03-19 NOTE — Telephone Encounter (Signed)
Printed demographics for submission.  

## 2019-03-24 ENCOUNTER — Telehealth: Payer: Self-pay

## 2019-03-24 NOTE — Telephone Encounter (Signed)
Patient is approved for Monovisc, bilateral knee per Wendy May. Buy & Bill No Co-pay No PA required  Appt. 03/31/2019 with Dr. Lorin Mercy

## 2019-03-31 ENCOUNTER — Ambulatory Visit (INDEPENDENT_AMBULATORY_CARE_PROVIDER_SITE_OTHER): Payer: Medicare HMO | Admitting: Orthopaedic Surgery

## 2019-03-31 ENCOUNTER — Other Ambulatory Visit: Payer: Self-pay

## 2019-03-31 ENCOUNTER — Encounter: Payer: Self-pay | Admitting: Orthopaedic Surgery

## 2019-03-31 DIAGNOSIS — M170 Bilateral primary osteoarthritis of knee: Secondary | ICD-10-CM | POA: Diagnosis not present

## 2019-03-31 MED ORDER — HYALURONAN 88 MG/4ML IX SOSY
88.0000 mg | PREFILLED_SYRINGE | INTRA_ARTICULAR | Status: AC | PRN
  Administered 2019-03-31: 88 mg via INTRA_ARTICULAR

## 2019-03-31 MED ORDER — LIDOCAINE HCL 1 % IJ SOLN
0.5000 mL | INTRAMUSCULAR | Status: AC | PRN
  Administered 2019-03-31: .5 mL

## 2019-03-31 NOTE — Progress Notes (Signed)
Office Visit Note   Patient: Stephanie Villegas           Date of Birth: 15-Apr-1951           MRN: [id number] Visit Date: 03/31/2019              Requested by: Leotis Pain, [place],  [place] [address] PCP: Leotis Pain, MD   Assessment & Plan: Visit Diagnoses:  1. Bilateral primary osteoarthritis of knee     Plan: Right and left knee Monovisc injections performed she tolerated the injection well.  Follow-Up Instructions: Return if symptoms worsen or fail to improve.   Orders:  Orders Placed This Encounter  Procedures  . Large Joint Inj  . Large Joint Inj   No orders of the defined types were placed in this encounter.     Procedures: Large Joint Inj: R knee on 03/31/2019 10:51 AM Indications: pain and joint swelling Details: 22 G 1.5 in needle, anterolateral approach  Arthrogram: No  Medications: 0.5 mL lidocaine 1 %; 88 mg Hyaluronan 88 MG/4ML Outcome: tolerated well, no immediate complications Procedure, treatment alternatives, risks and benefits explained, specific risks discussed. Consent was given by the patient. Immediately prior to procedure a time out was called to verify the correct patient, procedure, equipment, support staff and site/side marked as required. Patient was prepped and draped in the usual sterile fashion.   Large Joint Inj: L knee on 03/31/2019 10:51 AM Indications: joint swelling and pain Details: 22 G 1.5 in needle, anterolateral approach  Arthrogram: No  Medications: 0.5 mL lidocaine 1 %; 88 mg Hyaluronan 88 MG/4ML Outcome: tolerated well, no immediate complications Procedure, treatment alternatives, risks and benefits explained, specific risks discussed. Consent was given by the patient. Immediately prior to procedure a time out was called to verify the correct patient, procedure, equipment, support staff and site/side marked as required. Patient was prepped and draped in the usual sterile fashion.        Clinical Data: No additional findings.   Subjective: Chief Complaint  Patient presents with  . Left Knee - Pain    Monovisc Injection   . Right Knee - Pain    Monovisc Injection    HPI 68 year old female returns for injection of Monovisc which is been approved for both right and left knee.  Review of Systems updated unchanged   Objective: Vital Signs: Ht 5' (1.524 m)   Wt 111 lb (50.3 kg)   BMI 21.68 kg/m   Physical Exam no change.  Ortho Exam patient has abrasion over the lateral joint line right knee from a fall last weekend.  Opposite left knee shows trace abrasion over the tibial tubercle.  Distal pulses are intact no cellulitis.  Trace right and left knee effusion.  Ligamentous exam is normal.  Specialty Comments:  No specialty comments available.  Imaging: No results found.   PMFS History: Patient Active Problem List   Diagnosis Date Noted  . Bilateral primary osteoarthritis of knee 03/31/2019  . Unilateral primary osteoarthritis, right knee 07/08/2018   No past medical history on file.  No family history on file.  No past surgical history on file. Social History   Occupational History  . Not on file  Tobacco Use  . Smoking status: Never Smoker  . Smokeless tobacco: Never Used  Substance and Sexual Activity  . Alcohol use: Not on file  . Drug use: Not on file  . Sexual activity: Not on file

## 2020-04-28 IMAGING — MR MR KNEE*L* W/O CM
4 of 7 series · 23 of 40 positions shown · non-contrast
Comparison: None.

CLINICAL DATA: Locking catching. Snapping and crepitus in the knee
for greater than 6 weeks.

EXAM:
MRI OF THE LEFT KNEE WITHOUT CONTRAST
TECHNIQUE: Multiplanar, multisequence MR imaging of the knee was performed. No
intravenous contrast was administered.

[Series 4: T2 fat-sat · coronal · 4.0mm · 0.59mm/px · 6 of 23 slices shown (1 of 2)]
[im 1/23]
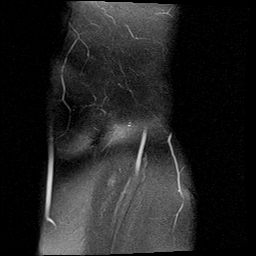
[im 5/23]
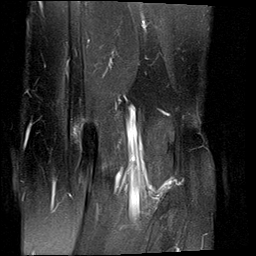
[im 9/23]
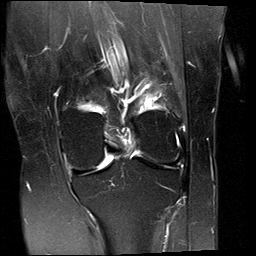
[im 14/23]
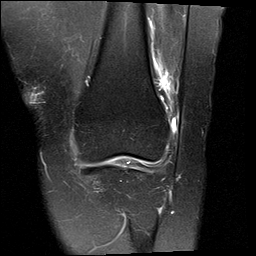
[im 18/23]
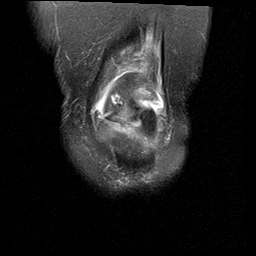
[im 23/23]
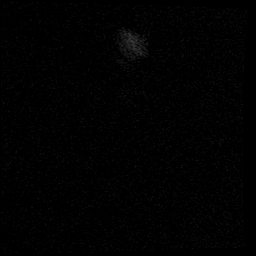

[Series 5: T1 · coronal · 4.0mm · 0.29mm/px · 5 of 23 slices shown]
[im 1/23]
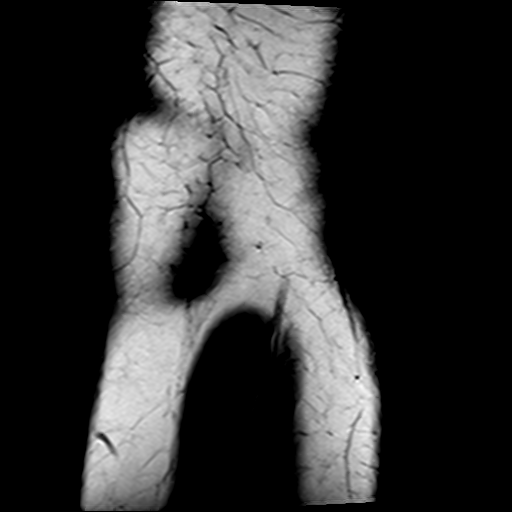
[im 5/23]
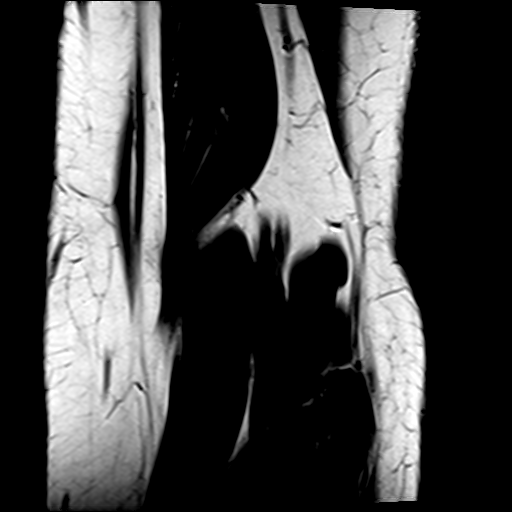
[im 9/23]
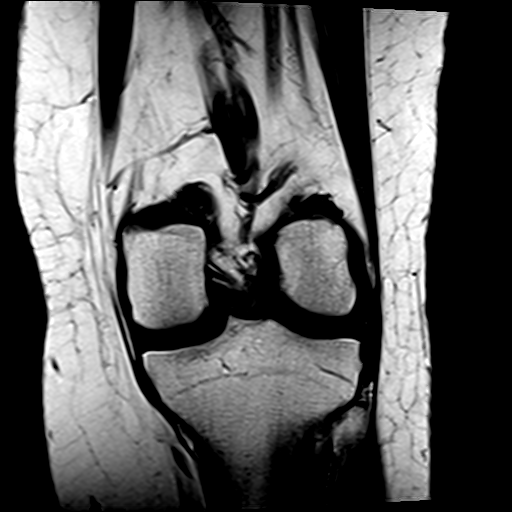
[im 14/23]
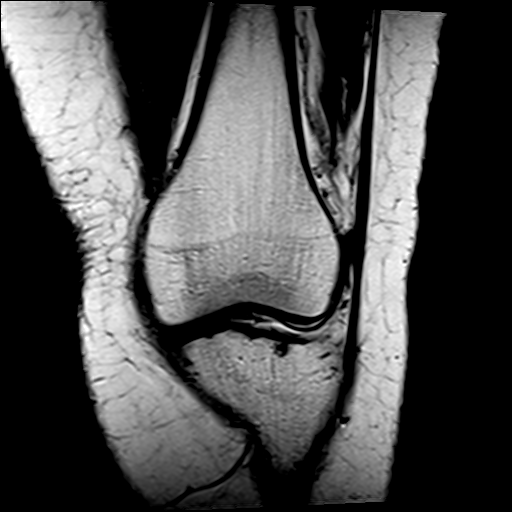
[im 23/23]
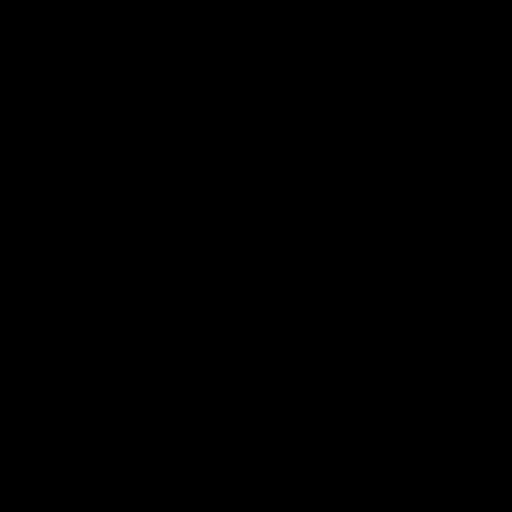

[Series 7: PD fat-sat · sagittal · 3.0mm · 0.29mm/px · 6 of 25 slices shown]
[im 1/25]
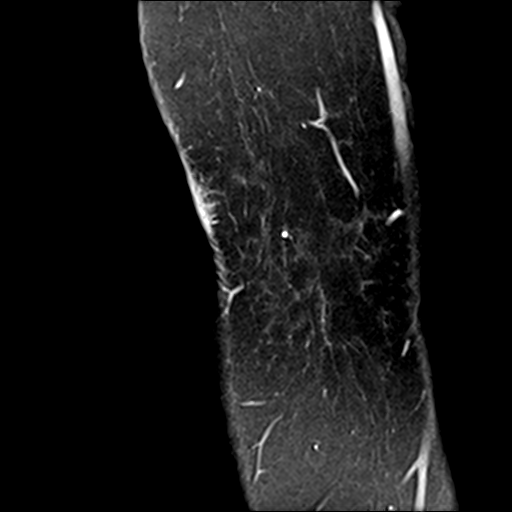
[im 5/25]
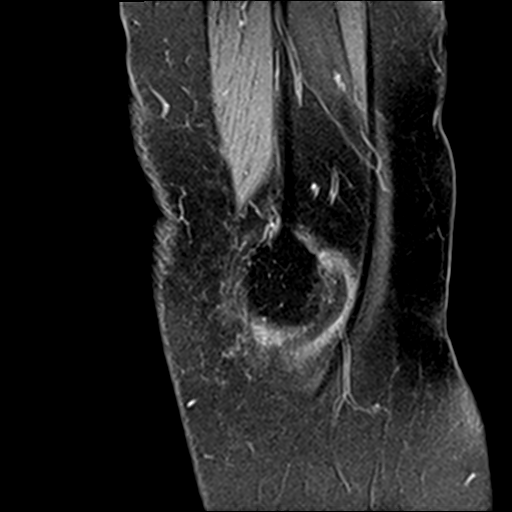
[im 10/25]
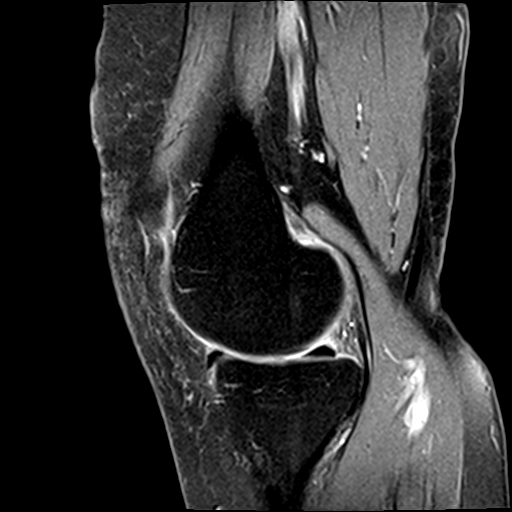
[im 15/25]
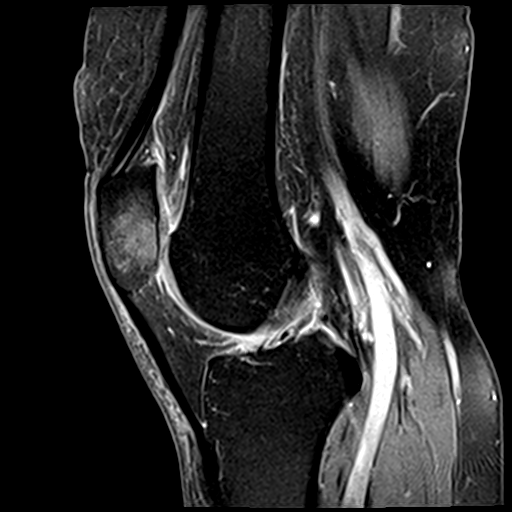
[im 20/25]
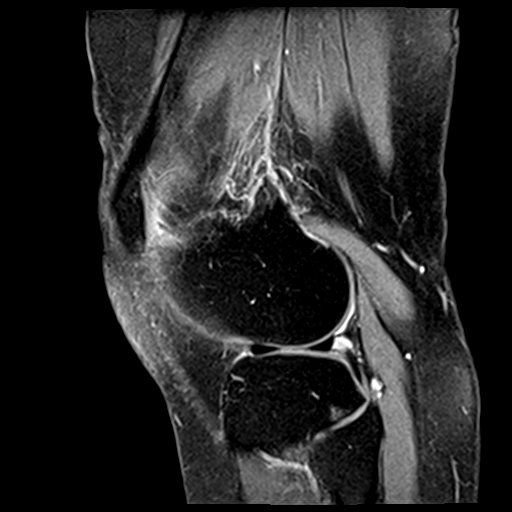
[im 25/25]
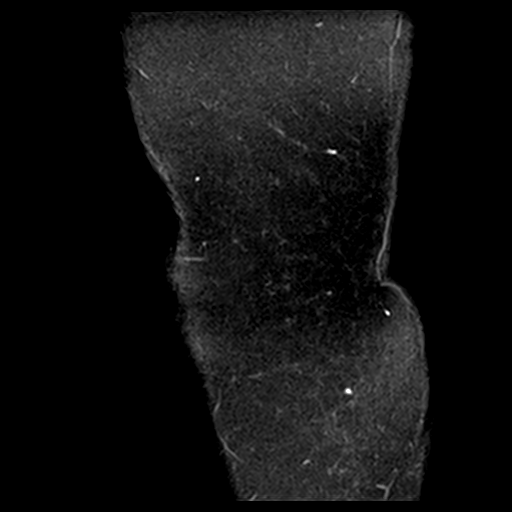

[Series 8: T2 fat-sat · sagittal · 3.0mm · 0.29mm/px · 6 of 25 slices shown (2 of 2)]
[im 1/25]
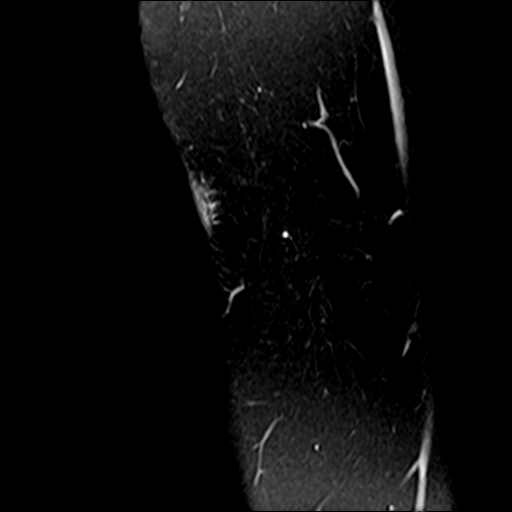
[im 5/25]
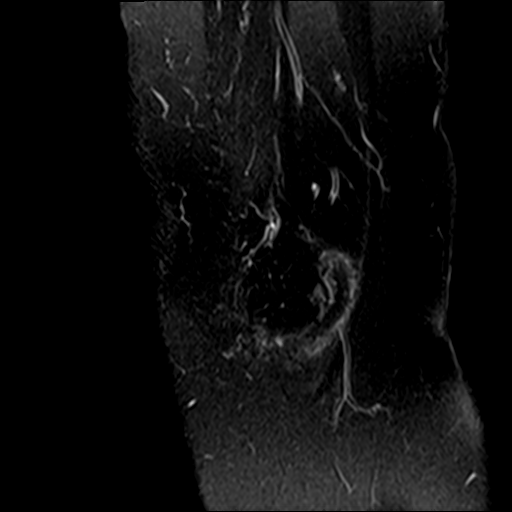
[im 10/25]
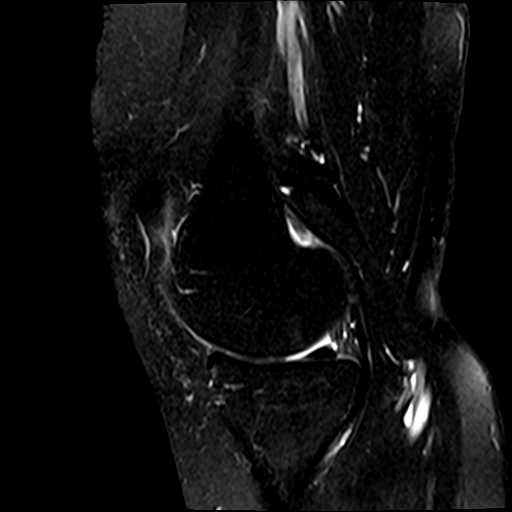
[im 15/25]
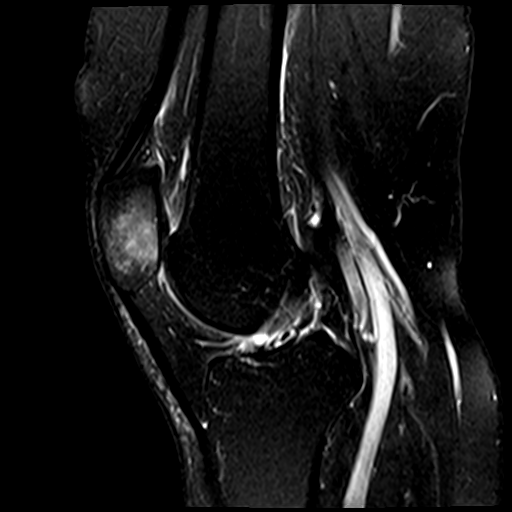
[im 20/25]
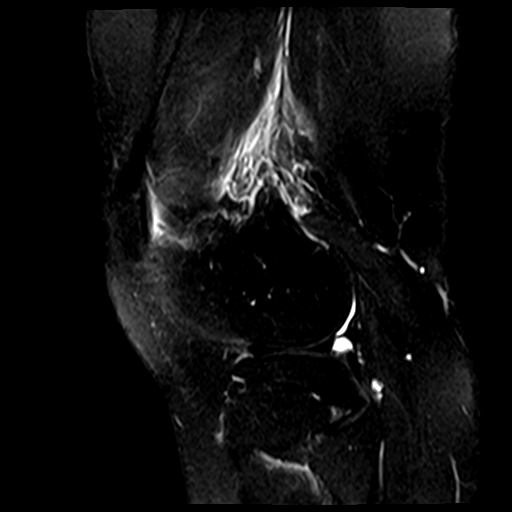
[im 25/25]
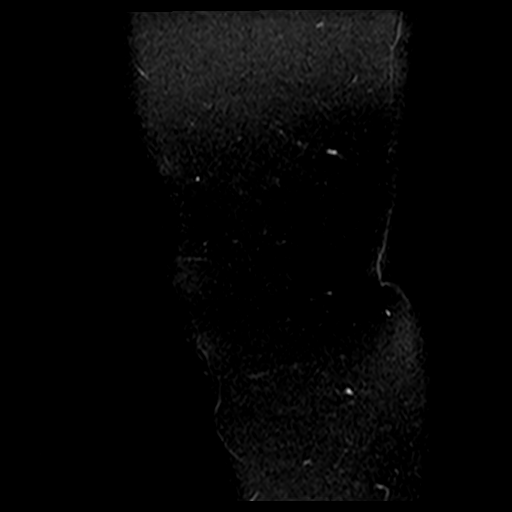

[23 of 40 positions shown; findings below may reference images not displayed]

FINDINGS: MENISCI

Medial meniscus:  Intact.

Lateral meniscus:  Intact.

LIGAMENTS

Cruciates:  Intact ACL and PCL.

Collaterals:  Insert collateral

CARTILAGE

Patellofemoral: High-grade partial-thickness cartilage loss with
full-thickness cartilage fissuring of the medial patellar facet with
severe subchondral reactive marrow edema and cystic changes.

Medial: Focal area of full-thickness cartilage loss of the medial
femoral condyle the posterior weight-bearing surface measuring 8 x
12 mm.

Lateral:  No chondral defect.

Joint: No joint effusion. Mild edema in superolateral Hoffa's fat.
No plical thickening.

Popliteal Fossa:  No Baker cyst. Intact popliteus tendon.

Extensor Mechanism: Intact quadriceps tendon. Intact patellar
tendon. Intact medial patellar retinaculum. Intact lateral patellar
retinaculum. Intact MPFL. TT-TG distance 11 mm.

Bones:  No acute osseous abnormality.  No aggressive osseous lesion.

Other: No fluid collection or hematoma.  Muscles are normal.
IMPRESSION: 1. No meniscal or ligamentous injury of the left knee.
2. High-grade partial-thickness cartilage loss with full-thickness
cartilage fissuring of the medial patellar facet with severe
subchondral reactive marrow edema and cystic changes.
3. Focal area of full-thickness cartilage loss of the medial femoral
condyle the posterior weight-bearing surface measuring 8 x 12 mm.
4. Mild edema in superolateral Hoffa's fat as can be seen with
patellar tendon-lateral femoral condyle friction syndrome.

## 2020-07-17 ENCOUNTER — Telehealth: Payer: Self-pay | Admitting: Orthopaedic Surgery

## 2020-07-17 NOTE — Telephone Encounter (Signed)
Please obtain authorization for bilateral knee gel injections-[facility]

## 2020-07-17 NOTE — Telephone Encounter (Signed)
Noted  

## 2020-07-17 NOTE — Telephone Encounter (Signed)
°  Ok to seek authorization for bilateral knee gel injections? Per the chart, they were last done 03/31/2019. Thanks.

## 2020-07-17 NOTE — Telephone Encounter (Signed)
Yes OK . I assume she is asking for gel  and not cortisone. Thanks .

## 2020-07-17 NOTE — Telephone Encounter (Signed)
Pt called stating she would like to get gel bilateral knee injections the last one was on 03/30/20  [phone]

## 2020-07-25 ENCOUNTER — Telehealth: Payer: Self-pay

## 2020-07-25 NOTE — Telephone Encounter (Signed)
Submitted VOB, Monovisc, bilateral knee. 

## 2020-08-04 ENCOUNTER — Telehealth: Payer: Self-pay

## 2020-08-04 NOTE — Telephone Encounter (Signed)
Approved for Monovisc-Bilateral knee Dr. Franki Cabot and Bill $25 copay 20% OOP Prior auth required with [employer] # [id number] Dates: 08/04/20-02/01/21

## 2020-08-07 NOTE — Telephone Encounter (Signed)
Called pt and scheduled. Pt is aware for copay

## 2020-08-23 ENCOUNTER — Ambulatory Visit (INDEPENDENT_AMBULATORY_CARE_PROVIDER_SITE_OTHER): Payer: Medicare HMO | Admitting: Orthopaedic Surgery

## 2020-08-23 ENCOUNTER — Other Ambulatory Visit: Payer: Self-pay

## 2020-08-23 ENCOUNTER — Encounter: Payer: Self-pay | Admitting: Orthopaedic Surgery

## 2020-08-23 VITALS — Ht 60.0 in | Wt 106.0 lb

## 2020-08-23 DIAGNOSIS — M170 Bilateral primary osteoarthritis of knee: Secondary | ICD-10-CM

## 2020-08-23 DIAGNOSIS — M7542 Impingement syndrome of left shoulder: Secondary | ICD-10-CM | POA: Insufficient documentation

## 2020-08-23 MED ORDER — HYALURONAN 88 MG/4ML IX SOSY
88.0000 mg | PREFILLED_SYRINGE | INTRA_ARTICULAR | Status: AC | PRN
  Administered 2020-08-23: 88 mg via INTRA_ARTICULAR

## 2020-08-23 MED ORDER — LIDOCAINE HCL 1 % IJ SOLN
0.5000 mL | INTRAMUSCULAR | Status: AC | PRN
  Administered 2020-08-23: .5 mL

## 2020-08-23 NOTE — Progress Notes (Signed)
Office Visit Note   Patient: Stephanie Villegas           Date of Birth: August 11, 1951           MRN: [id number] Visit Date: 08/23/2020              Requested by: Wilfred Curtis, MD [address] [place],  [place] [address] PCP: Wilfred Curtis, MD   Assessment & Plan: Visit Diagnoses:  1. Impingement syndrome of left shoulder   2. Bilateral primary osteoarthritis of knee     Plan: We will set patient up for some physical therapy for treatment left shoulder impingement with biceps tendinopathy.  Recheck 6 weeks.  Right and left knee Monovisc injection performed which she tolerated well.  Follow-Up Instructions: Return in about 6 weeks (around 10/04/2020).   Orders:  No orders of the defined types were placed in this encounter.  No orders of the defined types were placed in this encounter.     Procedures: Large Joint Inj: R knee on 08/23/2020 10:49 AM Indications: pain and joint swelling Details: 22 G 1.5 in needle, anterolateral approach  Arthrogram: No  Medications: 0.5 mL lidocaine 1 %; 88 mg Hyaluronan 88 MG/4ML Outcome: tolerated well, no immediate complications Procedure, treatment alternatives, risks and benefits explained, specific risks discussed. Consent was given by the patient. Immediately prior to procedure a time out was called to verify the correct patient, procedure, equipment, support staff and site/side marked as required. Patient was prepped and draped in the usual sterile fashion.   Large Joint Inj: L knee on 08/23/2020 10:49 AM Indications: joint swelling and pain Details: 22 G 1.5 in needle, anterolateral approach  Arthrogram: No  Medications: 0.5 mL lidocaine 1 %; 88 mg Hyaluronan 88 MG/4ML Outcome: tolerated well, no immediate complications Procedure, treatment alternatives, risks and benefits explained, specific risks discussed. Consent was given by the patient. Immediately prior to procedure a time out was called to verify the correct patient,  procedure, equipment, support staff and site/side marked as required. Patient was prepped and draped in the usual sterile fashion.       Clinical Data: No additional findings.   Subjective: Chief Complaint  Patient presents with  . Right Knee - Pain    monovisc injection  . Left Knee - Pain    monovisc injection  . Left Shoulder - Pain    HPI patient returns for her third of 3 Monovisc injection for both knees which is worked well in the past.  She also has a new problem which is left anterior shoulder pain and points directly over the biceps tendon and has pain with outstretch reaching overhead activity and internal rotation.  She does not recall any fall or injury or any abnormal activity to the left shoulder.  She is noted some improvement with the previous 2 Monovisc injections both knees.  Review of Systems all other systems update unchanged.   Objective: Vital Signs: Ht 5' (1.524 m)   Wt 106 lb (48.1 kg)   BMI 20.70 kg/m   Physical Exam Constitutional:      Appearance: She is well-developed.  HENT:     Head: Normocephalic.     Right Ear: External ear normal.     Left Ear: External ear normal.  Eyes:     Pupils: Pupils are equal, round, and reactive to light.  Neck:     Thyroid: No thyromegaly.     Trachea: No tracheal deviation.  Cardiovascular:  Rate and Rhythm: Normal rate.  Pulmonary:     Effort: Pulmonary effort is normal.  Abdominal:     Palpations: Abdomen is soft.  Skin:    General: Skin is warm and dry.  Neurological:     Mental Status: She is alert and oriented to person, place, and time.  Psychiatric:        Behavior: Behavior normal.     Ortho Exam knee crepitus right and left.  Left shoulder anterior biceps groove tenderness.  No distal biceps migration.  Positive Hawkins negative Neer negative drop arm test.  Specialty Comments:  No specialty comments available.  Imaging: No results found.   PMFS History: Patient Active Problem  List   Diagnosis Date Noted  . Impingement syndrome of left shoulder 08/23/2020  . Bilateral primary osteoarthritis of knee 03/31/2019  . Unilateral primary osteoarthritis, right knee 07/08/2018   No past medical history on file.  No family history on file.  No past surgical history on file. Social History   Occupational History  . Not on file  Tobacco Use  . Smoking status: Never Smoker  . Smokeless tobacco: Never Used  Substance and Sexual Activity  . Alcohol use: Not on file  . Drug use: Not on file  . Sexual activity: Not on file

## 2020-08-23 NOTE — Addendum Note (Signed)
Addended by: Rogers Seeds on: 08/23/2020 11:48 AM   Modules accepted: Orders

## 2023-07-04 ENCOUNTER — Other Ambulatory Visit: Payer: Self-pay | Admitting: Student in an Organized Health Care Education/Training Program

## 2023-07-04 DIAGNOSIS — M47817 Spondylosis without myelopathy or radiculopathy, lumbosacral region: Secondary | ICD-10-CM

## 2023-07-29 ENCOUNTER — Encounter: Payer: Self-pay | Admitting: Student in an Organized Health Care Education/Training Program

## 2023-07-31 ENCOUNTER — Other Ambulatory Visit: Payer: Self-pay | Admitting: Student in an Organized Health Care Education/Training Program

## 2023-08-01 ENCOUNTER — Ambulatory Visit
Admission: RE | Admit: 2023-08-01 | Discharge: 2023-08-01 | Disposition: A | Discharge status: Home / Self Care | Payer: Medicare HMO | Source: Ambulatory Visit | Attending: Student in an Organized Health Care Education/Training Program | Admitting: Student in an Organized Health Care Education/Training Program

## 2023-08-01 ENCOUNTER — Other Ambulatory Visit: Payer: Medicare HMO

## 2023-08-01 DIAGNOSIS — M47817 Spondylosis without myelopathy or radiculopathy, lumbosacral region: Secondary | ICD-10-CM
